# Patient Record
Sex: Female | Born: 2000 | Race: Black or African American | Hispanic: No | Marital: Single | Smoking: Never smoker
Health system: Southern US, Community
[De-identification: ages and names within clinical notes are randomized; demographics above are authoritative.]

---

## 2020-06-01 ENCOUNTER — Ambulatory Visit: Payer: Self-pay | Attending: Internal Medicine

## 2020-06-01 DIAGNOSIS — Z23 Encounter for immunization: Secondary | ICD-10-CM

## 2020-06-01 NOTE — Progress Notes (Signed)
° °  Covid-19 Vaccination Clinic  Name:  Finley Dinkel    MRN: [id number] DOB: 04-28-2001  06/01/2020  Ms. Christine was observed post Covid-19 immunization for 15 minutes without incident. She was provided with Vaccine Information Sheet and instruction to access the V-Safe system.   Ms. Crossett was instructed to call 911 with any severe reactions post vaccine:  Difficulty breathing   Swelling of face and throat   A fast heartbeat   A bad rash all over body   Dizziness and weakness   Immunizations Administered    Name Date Dose VIS Date Route   Pfizer COVID-19 Vaccine 06/01/2020 12:50 PM 0.3 mL 12/03/2018 Intramuscular   Manufacturer: [employer], [employer]   Lot: [id number]   NDC: [address]-1

## 2021-01-24 ENCOUNTER — Other Ambulatory Visit: Payer: Self-pay

## 2021-01-24 ENCOUNTER — Emergency Department (HOSPITAL_COMMUNITY): Payer: Commercial Managed Care - PPO

## 2021-01-24 ENCOUNTER — Emergency Department (HOSPITAL_COMMUNITY)
Admission: EM | Admit: 2021-01-24 | Discharge: 2021-01-24 | Disposition: A | Discharge status: Home / Self Care | Payer: Commercial Managed Care - PPO | Attending: Emergency Medicine | Admitting: Emergency Medicine

## 2021-01-24 ENCOUNTER — Encounter (HOSPITAL_COMMUNITY): Payer: Self-pay | Admitting: Emergency Medicine

## 2021-01-24 DIAGNOSIS — R6883 Chills (without fever): Secondary | ICD-10-CM | POA: Diagnosis not present

## 2021-01-24 DIAGNOSIS — R0981 Nasal congestion: Secondary | ICD-10-CM | POA: Diagnosis not present

## 2021-01-24 DIAGNOSIS — R110 Nausea: Secondary | ICD-10-CM | POA: Diagnosis not present

## 2021-01-24 DIAGNOSIS — R000 Tachycardia, unspecified: Secondary | ICD-10-CM | POA: Insufficient documentation

## 2021-01-24 DIAGNOSIS — M791 Myalgia, unspecified site: Secondary | ICD-10-CM | POA: Diagnosis not present

## 2021-01-24 DIAGNOSIS — R059 Cough, unspecified: Secondary | ICD-10-CM | POA: Diagnosis not present

## 2021-01-24 DIAGNOSIS — Z20822 Contact with and (suspected) exposure to covid-19: Secondary | ICD-10-CM | POA: Insufficient documentation

## 2021-01-24 LAB — SARS CORONAVIRUS 2 (TAT 6-24 HRS): SARS Coronavirus 2: NEGATIVE

## 2021-01-24 MED ORDER — ONDANSETRON 4 MG PO TBDP: 4.0000 mg | ORAL_TABLET | Freq: Three times a day (TID) | ORAL | 0 refills | Status: AC | PRN

## 2021-01-24 MED ORDER — BENZONATATE 100 MG PO CAPS: 100.0000 mg | ORAL_CAPSULE | Freq: Three times a day (TID) | ORAL | 0 refills | Status: AC

## 2021-01-24 NOTE — Discharge Instructions (Addendum)
You were seen in the emergency department today for a cough with chills, fatigue, and nausea.  Your chest x-ray was normal. We have tested you for COVID 19, we will call you within the next 48 hours if results are positive, you may also view these results on MyChart.   We are instructing patient's with COVID 19 or symptoms of COVID 19 to follow the below instructions regardless of vaccination status:  - Stay home for 5 days. - If you have no symptoms or your symptoms are resolving after 5 days, you can leave your house--> Continue to wear a mask around others for 5 additional days. - If you have a fever, continue to stay home until your fever resolves.days.   We are sending you home with Zofran to take every 8 hours as needed for nausea/vomiting and Tessalon to take every 8 hours as needed for coughing.  We have prescribed you new medication(s) today. Discuss the medications prescribed today with your pharmacist as they can have adverse effects and interactions with your other medicines including over the counter and prescribed medications. Seek medical evaluation if you start to experience new or abnormal symptoms after taking one of these medicines, seek care immediately if you start to experience difficulty breathing, feeling of your throat closing, facial swelling, or rash as these could be indications of a more serious allergic reaction  Please follow up with primary care within 3-5 days for re-evaluation- call prior to going to the office to make them aware of your symptoms as some offices are altering their method of seeing patients with COVID 19 symptoms, we have also provided our [place] covid clinic for follow up as well.  Return to the ER for new or worsening symptoms including but not limited to increased work of breathing, chest pain, passing out, or any other concerns.       Person Under Monitoring Name: Gina Huynh  Location: [address] [place] [address]   Infection  Prevention Recommendations for Individuals Confirmed to have, or Being Evaluated for, 2019 Novel Coronavirus (COVID-19) Infection Who Receive Care at Home  Individuals who are confirmed to have, or are being evaluated for, COVID-19 should follow the prevention steps below until a healthcare provider or local or state health department says they can return to normal activities.  Stay home except to get medical care You should restrict activities outside your home, except for getting medical care. Do not go to work, school, or public areas, and do not use public transportation or taxis.  Call ahead before visiting your doctor Before your medical appointment, call the healthcare provider and tell them that you have, or are being evaluated for, COVID-19 infection. This will help the healthcare provider's office take steps to keep other people from getting infected. Ask your healthcare provider to call the local or state health department.  Monitor your symptoms Seek prompt medical attention if your illness is worsening (e.g., difficulty breathing). Before going to your medical appointment, call the healthcare provider and tell them that you have, or are being evaluated for, COVID-19 infection. Ask your healthcare provider to call the local or state health department.  Wear a facemask You should wear a facemask that covers your nose and mouth when you are in the same room with other people and when you visit a healthcare provider. People who live with or visit you should also wear a facemask while they are in the same room with you.  Separate yourself from  other people in your home As much as possible, you should stay in a different room from other people in your home. Also, you should use a separate bathroom, if available.  Avoid sharing household items You should not share dishes, drinking glasses, cups, eating utensils, towels, bedding, or other items with other people in your home.  After using these items, you should wash them thoroughly with soap and water.  Cover your coughs and sneezes Cover your mouth and nose with a tissue when you cough or sneeze, or you can cough or sneeze into your sleeve. Throw used tissues in a lined trash can, and immediately wash your hands with soap and water for at least 20 seconds or use an alcohol-based hand rub.  Wash your [employer] your hands often and thoroughly with soap and water for at least 20 seconds. You can use an alcohol-based hand sanitizer if soap and water are not available and if your hands are not visibly dirty. Avoid touching your eyes, nose, and mouth with unwashed hands.   Prevention Steps for Caregivers and Household Members of Individuals Confirmed to have, or Being Evaluated for, COVID-19 Infection Being Cared for in the Home  If you live with, or provide care at home for, a person confirmed to have, or being evaluated for, COVID-19 infection please follow these guidelines to prevent infection:  Follow healthcare provider's instructions Make sure that you understand and can help the patient follow any healthcare provider instructions for all care.  Provide for the patient's basic needs You should help the patient with basic needs in the home and provide support for getting groceries, prescriptions, and other personal needs.  Monitor the patient's symptoms If they are getting sicker, call his or her medical provider and tell them that the patient has, or is being evaluated for, COVID-19 infection. This will help the healthcare provider's office take steps to keep other people from getting infected. Ask the healthcare provider to call the local or state health department.  Limit the number of people who have contact with the patient If possible, have only one caregiver for the patient. Other household members should stay in another home or place of residence. If this is not possible, they should stay in  another room, or be separated from the patient as much as possible. Use a separate bathroom, if available. Restrict visitors who do not have an essential need to be in the home.  Keep older adults, very young children, and other sick people away from the patient Keep older adults, very young children, and those who have compromised immune systems or chronic health conditions away from the patient. This includes people with chronic heart, lung, or kidney conditions, diabetes, and cancer.  Ensure good ventilation Make sure that shared spaces in the home have good air flow, such as from an air conditioner or an opened window, weather permitting.  Wash your hands often Wash your hands often and thoroughly with soap and water for at least 20 seconds. You can use an alcohol based hand sanitizer if soap and water are not available and if your hands are not visibly dirty. Avoid touching your eyes, nose, and mouth with unwashed hands. Use disposable paper towels to dry your hands. If not available, use dedicated cloth towels and replace them when they become wet.  Wear a facemask and gloves Wear a disposable facemask at all times in the room and gloves when you touch or have contact with the patient's blood, body fluids,  and/or secretions or excretions, such as sweat, saliva, sputum, nasal mucus, vomit, urine, or feces.  Ensure the mask fits over your nose and mouth tightly, and do not touch it during use. Throw out disposable facemasks and gloves after using them. Do not reuse. Wash your hands immediately after removing your facemask and gloves. If your personal clothing becomes contaminated, carefully remove clothing and launder. Wash your hands after handling contaminated clothing. Place all used disposable facemasks, gloves, and other waste in a lined container before disposing them with other household waste. Remove gloves and wash your hands immediately after handling these items.  Do not share  dishes, glasses, or other household items with the patient Avoid sharing household items. You should not share dishes, drinking glasses, cups, eating utensils, towels, bedding, or other items with a patient who is confirmed to have, or being evaluated for, COVID-19 infection. After the person uses these items, you should wash them thoroughly with soap and water.  Wash laundry thoroughly Immediately remove and wash clothes or bedding that have blood, body fluids, and/or secretions or excretions, such as sweat, saliva, sputum, nasal mucus, vomit, urine, or feces, on them. Wear gloves when handling laundry from the patient. Read and follow directions on labels of laundry or clothing items and detergent. In general, wash and dry with the warmest temperatures recommended on the label.  Clean all areas the individual has used often Clean all touchable surfaces, such as counters, tabletops, doorknobs, bathroom fixtures, toilets, phones, keyboards, tablets, and bedside tables, every day. Also, clean any surfaces that may have blood, body fluids, and/or secretions or excretions on them. Wear gloves when cleaning surfaces the patient has come in contact with. Use a diluted bleach solution (e.g., dilute bleach with 1 part bleach and 10 parts water) or a household disinfectant with a label that says EPA-registered for coronaviruses. To make a bleach solution at home, add 1 tablespoon of bleach to 1 quart (4 cups) of water. For a larger supply, add  cup of bleach to 1 gallon (16 cups) of water. Read labels of cleaning products and follow recommendations provided on product labels. Labels contain instructions for safe and effective use of the cleaning product including precautions you should take when applying the product, such as wearing gloves or eye protection and making sure you have good ventilation during use of the product. Remove gloves and wash hands immediately after cleaning.  Monitor yourself for  signs and symptoms of illness Caregivers and household members are considered close contacts, should monitor their health, and will be asked to limit movement outside of the home to the extent possible. Follow the monitoring steps for close contacts listed on the symptom monitoring form.   ? If you have additional questions, contact your local health department or call the epidemiologist on call at [phone] (available 24/7). ? This guidance is subject to change. For the most up-to-date guidance from [facility], please refer to their website: [email or web address]

## 2021-01-24 NOTE — ED Provider Notes (Signed)
[facility]-EMERGENCY DEPT Provider Note   CSN: [id number] Arrival date & time: 01/24/21  0007     History Chief Complaint  Patient presents with  . Cough  . Generalized Body Aches    Gina Huynh is a 20 y.o. female without significant past medical hx who presents to the ED with complaints of cough x 3 days. Patient reports generally not feeling well with chills, body aches, congestion, cough productive of yellow phlegm sputum, and nausea. Had 1 episode of emesis yesterday, none since. No alleviating/aggravating factors. Recent sick contact with someone who tested positive for COVID 19. She has had her vaccines. Denies fever, chest pain, dyspnea, syncope, or abdominal pain.   HPI     History reviewed. No pertinent past medical history.  There are no problems to display for this patient.   History reviewed. No pertinent surgical history.   OB History   No obstetric history on file.     No family history on file.  Social History   Tobacco Use  . Smoking status: Never Smoker  . Smokeless tobacco: Never Used  Substance Use Topics  . Alcohol use: Never  . Drug use: Never    Home Medications Prior to Admission medications   Not on File    Allergies    Patient has no known allergies.  Review of Systems   Review of Systems  Constitutional: Positive for chills. Negative for fever.  HENT: Positive for congestion. Negative for ear pain and sore throat.   Respiratory: Positive for cough. Negative for shortness of breath.   Cardiovascular: Negative for chest pain.  Gastrointestinal: Positive for nausea and vomiting. Negative for abdominal pain and blood in stool.  Genitourinary: Negative for dysuria.  Musculoskeletal: Positive for myalgias.  Neurological: Negative for syncope.  All other systems reviewed and are negative.   Physical Exam Updated Vital Signs BP 124/89 (BP Location: Left Arm)   Pulse (!) 112   Temp 98.7 F (37.1 C) (Oral)    Resp 16   Ht 5\' 5"  (1.651 m)   Wt 62.6 kg   SpO2 100%   BMI 22.96 kg/m   Physical Exam Vitals and nursing note reviewed.  Constitutional:      General: She is not in acute distress.    Appearance: She is well-developed.  HENT:     Head: Normocephalic and atraumatic.     Right Ear: Ear canal normal. Tympanic membrane is not perforated, erythematous, retracted or bulging.     Left Ear: Ear canal normal. Tympanic membrane is not perforated, erythematous, retracted or bulging.     Ears:     Comments: No mastoid erythema/swelling/tenderness.     Nose:     Right Sinus: No maxillary sinus tenderness or frontal sinus tenderness.     Left Sinus: No maxillary sinus tenderness or frontal sinus tenderness.     Mouth/Throat:     Pharynx: Uvula midline. No oropharyngeal exudate or posterior oropharyngeal erythema.     Comments: Posterior oropharynx is symmetric appearing. Patient tolerating own secretions without difficulty. No trismus. No drooling. No hot potato voice. No swelling beneath the tongue, submandibular compartment is soft.  Eyes:     General:        Right eye: No discharge.        Left eye: No discharge.     Conjunctiva/sclera: Conjunctivae normal.     Pupils: Pupils are equal, round, and reactive to light.  Cardiovascular:     Rate and Rhythm: Regular  rhythm. Tachycardia present.     Heart sounds: No murmur heard.   Pulmonary:     Effort: Pulmonary effort is normal. No respiratory distress.     Breath sounds: Normal breath sounds. No wheezing, rhonchi or rales.  Abdominal:     General: There is no distension.     Palpations: Abdomen is soft.     Tenderness: There is no abdominal tenderness. There is no guarding or rebound.  Musculoskeletal:     Cervical back: Normal range of motion and neck supple. No edema or rigidity.  Lymphadenopathy:     Cervical: No cervical adenopathy.  Skin:    General: Skin is warm and dry.     Findings: No rash.  Neurological:     Mental  Status: She is alert.  Psychiatric:        Behavior: Behavior normal.     ED Results / Procedures / Treatments   Labs (all labs ordered are listed, but only abnormal results are displayed) Labs Reviewed  SARS CORONAVIRUS 2 (TAT 6-24 HRS)    EKG None  Radiology DG Chest Portable 1 View  Result Date: 01/24/2021 CLINICAL DATA:  Cough, fever EXAM: PORTABLE CHEST 1 VIEW COMPARISON:  None. FINDINGS: The heart size and mediastinal contours are within normal limits. Both lungs are clear. The visualized skeletal structures are unremarkable. IMPRESSION: Normal study. Electronically Signed   By: Charlett Nose M.D.   On: 01/24/2021 01:25    Procedures Procedures   Medications Ordered in ED Medications - No data to display  ED Course  I have reviewed the triage vital signs and the nursing notes.  Pertinent labs & imaging results that were available during my care of the patient were reviewed by me and considered in my medical decision making (see chart for details). Gina Huynh was evaluated in Emergency Department on 01/24/2021 for the symptoms described in the history of present illness. He/she was evaluated in the context of the global COVID-19 pandemic, which necessitated consideration that the patient might be at risk for infection with the SARS-CoV-2 virus that causes COVID-19. Institutional protocols and algorithms that pertain to the evaluation of patients at risk for COVID-19 are in a state of rapid change based on information released by regulatory bodies including the CDC and federal and state organizations. These policies and algorithms were followed during the patient's care in the ED.    .MDM Rules/Calculators/A&P                         Patient presents to the ED with complaints of cough, body aches, chills, & nausea.  Mild tachycardia resolved throughout ED stay.  Additional history obtained:  Additional history obtained from chart review & nursing note review.   Lab  Tests:  I Ordered COVID-19 test: Pending Imaging Studies ordered:  I ordered imaging studies which included chest x-ray, I independently visualized and interpreted imaging-impression by radiologist with normal study, I am agreement with this.  ED Course:  Exam is without signs of AOM, AOE, or mastoiditis. Oropharyngeal exam is benign. No sinus tenderness. No meningeal signs. Lungs are CTA without focal adventitious sounds, no signs of increased work of breathing, CXR without infiltrate, doubt CAP. Abdomen nontender w/o peritoneal signs-doubt surgical process. Tolerating PO. Overall suspect viral, greater than 48 hours of symptoms therefore outside of Tamiflu window.Marland Kitchen COVID testing obtained & pending. Recommended supportive care. I discussed results, treatment plan, need for follow-up, and return precautions with the patient. Provided  opportunity for questions, patient confirmed understanding and is in agreement with plan.    Portions of this note were generated with Scientist, clinical (histocompatibility and immunogenetics). Dictation errors may occur despite best attempts at proofreading.   Final Clinical Impression(s) / ED Diagnoses Final diagnoses:  Cough    Rx / DC Orders ED Discharge Orders         Ordered    benzonatate (TESSALON) 100 MG capsule  Every 8 hours        01/24/21 0233    ondansetron (ZOFRAN ODT) 4 MG disintegrating tablet  Every 8 hours PRN        01/24/21 0233           Cherly Anderson, PA-C 01/24/21 0241    Paula Libra, MD 01/24/21 [phone]

## 2021-01-24 NOTE — ED Triage Notes (Addendum)
Patient presents with possible COVID symptoms since Thursday. Patient states she was around someone last Sunday who ended up being positive for COVID. Patient endorses chills, body aches, productive cough with yellow tinged sputum and intermittent nausea with back pain. Patient denies urinary symptoms, no diarrhea or constipation. x1 episode of vomiting Saturday.

## 2021-12-03 ENCOUNTER — Ambulatory Visit (HOSPITAL_COMMUNITY)
Admission: EM | Admit: 2021-12-03 | Discharge: 2021-12-03 | Disposition: A | Discharge status: Home / Self Care | Payer: BC Managed Care – PPO | Attending: Physician Assistant | Admitting: Physician Assistant

## 2021-12-03 ENCOUNTER — Encounter (HOSPITAL_COMMUNITY): Payer: Self-pay

## 2021-12-03 ENCOUNTER — Other Ambulatory Visit: Payer: Self-pay

## 2021-12-03 DIAGNOSIS — S00452A Superficial foreign body of left ear, initial encounter: Secondary | ICD-10-CM | POA: Diagnosis not present

## 2021-12-03 MED ORDER — LIDOCAINE HCL (PF) 1 % IJ SOLN
INTRAMUSCULAR | Status: AC
  Filled 2021-12-03: qty 2

## 2021-12-03 NOTE — ED Provider Notes (Signed)
[facility]    CSN: [id number] Arrival date & time: 12/03/21  1607      History   Chief Complaint Chief Complaint  Patient presents with   Ear Injury    Pt reports her earring is stuck in her ear.    HPI Gina Huynh is a 21 y.o. female.   Patient presents today with a 1 day history of embedded earring in her left earlobe.  Reports she had a new earring placed several weeks ago and has noticed that he earring has receded into the earlobe with scar tissue overlying the anterior hole.  She attempted to remove this without success.  She denies any significant pain or swelling.  She is requesting this be removed by our clinic today.   History reviewed. No pertinent past medical history.  There are no problems to display for this patient.   History reviewed. No pertinent surgical history.  OB History   No obstetric history on file.      Home Medications    Prior to Admission medications   Medication Sig Start Date End Date Taking? Authorizing Provider  benzonatate (TESSALON) 100 MG capsule Take 1 capsule (100 mg total) by mouth every 8 (eight) hours. 01/24/21   Petrucelli, Samantha R, PA-C  ondansetron (ZOFRAN ODT) 4 MG disintegrating tablet Take 1 tablet (4 mg total) by mouth every 8 (eight) hours as needed for nausea or vomiting. 01/24/21   Petrucelli, Pleas Koch, PA-C    Family History History reviewed. No pertinent family history.  Social History Social History   Tobacco Use   Smoking status: Never   Smokeless tobacco: Never  Substance Use Topics   Alcohol use: Never   Drug use: Never     Allergies   Patient has no known allergies.   Review of Systems Review of Systems  Constitutional:  Positive for activity change. Negative for appetite change, fatigue and fever.  Neurological:  Negative for dizziness, light-headedness and headaches.    Physical Exam Triage Vital Signs ED Triage Vitals  Enc Vitals Group     BP 12/03/21 1714 111/67      Pulse Rate 12/03/21 1714 83     Resp 12/03/21 1714 16     Temp 12/03/21 1714 98.2 F (36.8 C)     Temp Source 12/03/21 1714 Oral     SpO2 12/03/21 1714 100 %     Weight --      Height --      Head Circumference --      Peak Flow --      Pain Score 12/03/21 1712 0     Pain Loc --      Pain Edu? --      Excl. in GC? --    No data found.  Updated Vital Signs BP 111/67 (BP Location: Left Arm)    Pulse 83    Temp 98.2 F (36.8 C) (Oral)    Resp 16    LMP 12/03/2021    SpO2 100%   Visual Acuity Right Eye Distance:   Left Eye Distance:   Bilateral Distance:    Right Eye Near:   Left Eye Near:    Bilateral Near:     Physical Exam Vitals reviewed.  Constitutional:      General: She is awake. She is not in acute distress.    Appearance: Normal appearance. She is well-developed. She is not ill-appearing.     Comments: Very pleasant female appears stated no acute distress  HENT:     Head: Normocephalic and atraumatic.     Ears:     Comments: Embedded earring in left earlobe Cardiovascular:     Rate and Rhythm: Normal rate and regular rhythm.     Heart sounds: Normal heart sounds, S1 normal and S2 normal. No murmur heard. Pulmonary:     Effort: Pulmonary effort is normal.     Breath sounds: Normal breath sounds. No wheezing, rhonchi or rales.  Psychiatric:        Behavior: Behavior is cooperative.     UC Treatments / Results  Labs (all labs ordered are listed, but only abnormal results are displayed) Labs Reviewed - No data to display  EKG   Radiology No results found.  Procedures Foreign Body Removal  Date/Time: 12/03/2021 5:54 PM Performed by: Jeani Hawking, PA-C Authorized by: Jeani Hawking, PA-C   Consent:    Consent obtained:  Verbal   Consent given by:  Patient   Risks, benefits, and alternatives were discussed: yes     Risks discussed:  Bleeding, worsening of condition and poor cosmetic result   Alternatives discussed:  Referral, observation and no  treatment Universal protocol:    Procedure explained and questions answered to patient or proxy's satisfaction: yes     Patient identity confirmed:  Verbally with patient Location:    Location:  Ear   Ear location:  L ear   Depth:  Intradermal   Tendon involvement:  None Pre-procedure details:    Imaging:  None   Neurovascular status: intact     Preparation: Patient was prepped and draped in usual sterile fashion   Anesthesia:    Anesthesia method:  Local infiltration   Local anesthetic:  Lidocaine 1% w/o epi Procedure type:    Procedure complexity:  Simple Procedure details:    Incision length:  1 cm   Localization method:  Probed   Dissection of underlying tissues: no     Removal mechanism:  Hemostat   Foreign bodies recovered:  1   Description:  Earring   Intact foreign body removal: yes   Post-procedure details:    Neurovascular status: intact     Confirmation:  No additional foreign bodies on visualization   Skin closure:  None   Dressing:  Open (no dressing)   Procedure completion:  Tolerated (including critical care time)  Medications Ordered in UC Medications - No data to display  Initial Impression / Assessment and Plan / UC Course  I have reviewed the triage vital signs and the nursing notes.  Pertinent labs & imaging results that were available during my care of the patient were reviewed by me and considered in my medical decision making (see chart for details).     Earring removed in clinic.  See procedure note above.  Adequate hemostasis obtained with direct pressure prior to leaving clinic.  Discussed that if she has any recurrent bleeding she should apply pressure.  She is to keep area clean.  Discussed signs or symptoms of infection that warrant reevaluation.  Strict return precautions given to which she expressed understanding.  Final Clinical Impressions(s) / UC Diagnoses   Final diagnoses:  Embedded earring of left ear, initial encounter      Discharge Instructions      We remove the foreign body.  Please keep pressure with any bleeding.  If you have any signs of infection including swelling, pain, drainage you need to be seen immediately.  Keep area clean with soap and water  and you can apply antibiotic ointment.  Do not try to place any earrings in this area.  Allow this to heal completely and if you desire you can have a repierce in the future.     ED Prescriptions   None    PDMP not reviewed this encounter.   Jeani Hawking, PA-C 12/03/21 1800

## 2021-12-03 NOTE — Discharge Instructions (Addendum)
We remove the foreign body.  Please keep pressure with any bleeding.  If you have any signs of infection including swelling, pain, drainage you need to be seen immediately.  Keep area clean with soap and water and you can apply antibiotic ointment.  Do not try to place any earrings in this area.  Allow this to heal completely and if you desire you can have a repierce in the future.

## 2022-03-22 IMAGING — DX DG CHEST 1V PORT
1 series · 1 of 1 positions shown · non-contrast
Comparison: None.

CLINICAL DATA: Cough, fever

EXAM:
PORTABLE CHEST 1 VIEW

[chest ap]
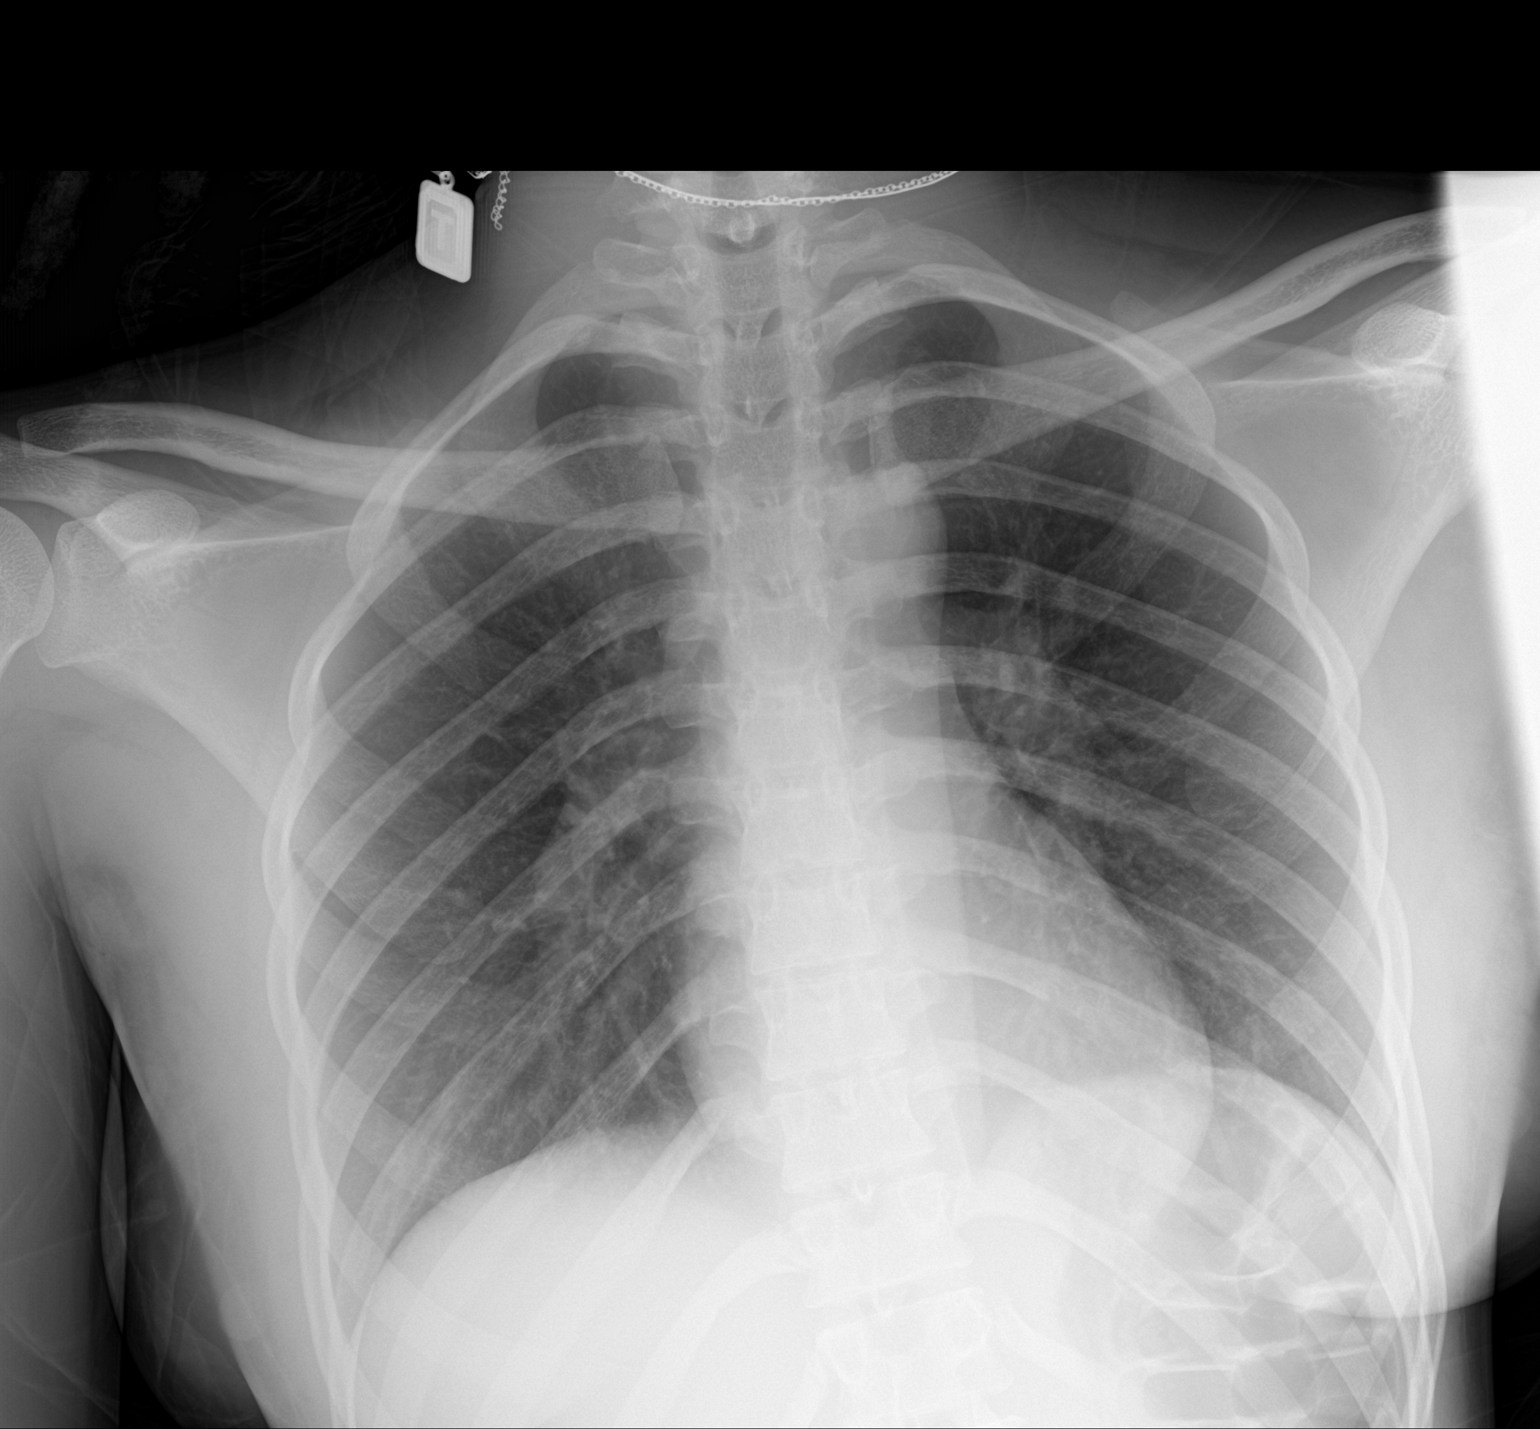

[1 of 1 positions shown; findings below may reference images not displayed]

FINDINGS: The heart size and mediastinal contours are within normal limits.
Both lungs are clear. The visualized skeletal structures are
unremarkable.
IMPRESSION: Normal study.
